# Patient Record
Sex: Female | Born: 1950 | Race: Black or African American | Hispanic: No | Marital: Married | Smoking: Never smoker
Health system: Southern US, Community
[De-identification: ages and names within clinical notes are randomized; demographics above are authoritative.]

## PROBLEM LIST (undated history)

## (undated) DIAGNOSIS — I10 Essential (primary) hypertension: Secondary | ICD-10-CM

## (undated) DIAGNOSIS — M1990 Unspecified osteoarthritis, unspecified site: Secondary | ICD-10-CM

## (undated) HISTORY — PX: JOINT REPLACEMENT: SHX530

## (undated) HISTORY — PX: ABDOMINAL HYSTERECTOMY: SHX81

---

## 2020-01-20 ENCOUNTER — Encounter (HOSPITAL_BASED_OUTPATIENT_CLINIC_OR_DEPARTMENT_OTHER): Payer: Self-pay | Admitting: Emergency Medicine

## 2020-01-20 ENCOUNTER — Emergency Department (HOSPITAL_BASED_OUTPATIENT_CLINIC_OR_DEPARTMENT_OTHER)
Admission: EM | Admit: 2020-01-20 | Discharge: 2020-01-20 | Disposition: A | Discharge status: Home / Self Care | Payer: Medicare Other | Attending: Emergency Medicine | Admitting: Emergency Medicine

## 2020-01-20 ENCOUNTER — Other Ambulatory Visit: Payer: Self-pay

## 2020-01-20 ENCOUNTER — Emergency Department (HOSPITAL_BASED_OUTPATIENT_CLINIC_OR_DEPARTMENT_OTHER): Payer: Medicare Other

## 2020-01-20 DIAGNOSIS — M25531 Pain in right wrist: Secondary | ICD-10-CM | POA: Insufficient documentation

## 2020-01-20 DIAGNOSIS — I10 Essential (primary) hypertension: Secondary | ICD-10-CM | POA: Insufficient documentation

## 2020-01-20 DIAGNOSIS — M79641 Pain in right hand: Secondary | ICD-10-CM | POA: Diagnosis present

## 2020-01-20 HISTORY — DX: Unspecified osteoarthritis, unspecified site: M19.90

## 2020-01-20 HISTORY — DX: Essential (primary) hypertension: I10

## 2020-01-20 MED ORDER — HYDROCODONE-ACETAMINOPHEN 5-325 MG PO TABS
1.0000 | ORAL_TABLET | ORAL | 0 refills | Status: AC | PRN
Start: 2020-01-20 — End: ?

## 2020-01-20 MED ORDER — MELOXICAM 15 MG PO TABS: 15.0000 mg | ORAL_TABLET | Freq: Every day | ORAL | 0 refills | Status: AC

## 2020-01-20 MED ORDER — DEXAMETHASONE SODIUM PHOSPHATE 10 MG/ML IJ SOLN
10.0000 mg | Freq: Once | INTRAMUSCULAR | Status: AC
  Administered 2020-01-20: 10 mg via INTRAMUSCULAR
  Filled 2020-01-20: qty 1

## 2020-01-20 NOTE — ED Provider Notes (Signed)
[facility] EMERGENCY DEPARTMENT Provider Note   CSN: [id number] Arrival date & time: 01/20/20  1008     History Chief Complaint  Patient presents with  . Hand Pain    Olivia Molina is a 69 y.o. female.  Pt presents to the ED today with right hand pain.  Pt said she has had hand pain for several months.  The pt said she's seen her orthopedist for her hand pain.  They recommended wearing a hand splint and f/u in 6 weeks.  That was in August.  She has not gotten the splint.  Her pcp put her on naprosyn which is not helping.         Past Medical History:  Diagnosis Date  . Arthritis   . Hypertension     There are no problems to display for this patient.      OB History   No obstetric history on file.     No family history on file.  Social History   Tobacco Use  . Smoking status: Never Smoker  . Smokeless tobacco: Never Used  Substance Use Topics  . Alcohol use: Not on file  . Drug use: Not on file    Home Medications Prior to Admission medications   Medication Sig Start Date End Date Taking? Authorizing Provider  HYDROcodone-acetaminophen (NORCO/VICODIN) 5-325 MG tablet Take 1 tablet by mouth every 4 (four) hours as needed. 01/20/20   Jacalyn Lefevre, MD  meloxicam (MOBIC) 15 MG tablet Take 1 tablet (15 mg total) by mouth daily. 01/20/20   Jacalyn Lefevre, MD    Allergies    Niacin  Review of Systems   Review of Systems  Musculoskeletal:       Right hand pain  All other systems reviewed and are negative.   Physical Exam Updated Vital Signs BP 130/88 (BP Location: Right Arm)   Pulse 60   Temp 97.9 F (36.6 C) (Oral)   Resp 18   Ht 5\' 3"  (1.6 m)   Wt 100.2 kg   SpO2 99%   BMI 39.15 kg/m   Physical Exam Vitals and nursing note reviewed.  Constitutional:      Appearance: Normal appearance.  HENT:     Head: Normocephalic and atraumatic.     Right Ear: External ear normal.     Left Ear: External ear normal.     Nose: Nose  normal.     Mouth/Throat:     Mouth: Mucous membranes are moist.     Pharynx: Oropharynx is clear.  Eyes:     Extraocular Movements: Extraocular movements intact.     Conjunctiva/sclera: Conjunctivae normal.     Pupils: Pupils are equal, round, and reactive to light.  Cardiovascular:     Rate and Rhythm: Normal rate and regular rhythm.     Pulses: Normal pulses.     Heart sounds: Normal heart sounds.  Pulmonary:     Effort: Pulmonary effort is normal.     Breath sounds: Normal breath sounds.  Abdominal:     General: Abdomen is flat. Bowel sounds are normal.     Palpations: Abdomen is soft.  Musculoskeletal:     Cervical back: Normal range of motion and neck supple.  Skin:    General: Skin is warm.     Capillary Refill: Capillary refill takes less than 2 seconds.  Neurological:     General: No focal deficit present.     Mental Status: She is alert and oriented to person, place, and time.  Psychiatric:        Mood and Affect: Mood normal.        Behavior: Behavior normal.     ED Results / Procedures / Treatments   Labs (all labs ordered are listed, but only abnormal results are displayed) Labs Reviewed - No data to display  EKG None  Radiology DG Hand Complete Right  Result Date: 01/20/2020 CLINICAL DATA:  Right hand pain with swelling in 2 fingers for 2 months. EXAM: RIGHT HAND - COMPLETE 3+ VIEW COMPARISON:  None. FINDINGS: The bones appear mildly demineralized. There is no evidence of acute fracture or dislocation. Mild degenerative changes are present at the 1st carpometacarpal joint. There are minimal degenerative changes throughout the interphalangeal and metacarpal phalangeal joints. No erosive changes or focal soft tissue abnormalities are seen. IMPRESSION: Mild degenerative changes as described. No acute findings. Electronically Signed   By: Carey Bullocks M.D.   On: 01/20/2020 11:11    Procedures Procedures (including critical care time)  Medications Ordered  in ED Medications  dexamethasone (DECADRON) injection 10 mg (10 mg Intramuscular Given 01/20/20 1212)    ED Course  I have reviewed the triage vital signs and the nursing notes.  Pertinent labs & imaging results that were available during my care of the patient were reviewed by me and considered in my medical decision making (see chart for details).    MDM Rules/Calculators/A&P                          Sx sound like carpal tunnel syndrome.  Her orthopedist recommended a resting volar splint to wear at night.  We only have cock- up splints.  She is given one of those and a dose of decadron.  She is d/c with mobic and lortab (#10) and instructed to f/u with her orthopedist.  Return if worse.  Final Clinical Impression(s) / ED Diagnoses Final diagnoses:  Right wrist pain    Rx / DC Orders ED Discharge Orders         Ordered    meloxicam (MOBIC) 15 MG tablet  Daily        01/20/20 1202    HYDROcodone-acetaminophen (NORCO/VICODIN) 5-325 MG tablet  Every 4 hours PRN        01/20/20 1202           Jacalyn Lefevre, MD 01/20/20 1412

## 2020-01-20 NOTE — ED Triage Notes (Addendum)
Pt having right hand pain with some swelling in two fingers for 2 months.  Has been seen previously for same.  Pt continues to have issues with pain worsening at night and some extreme issues with using left hand.  Pt requesting Xrays

## 2020-01-20 NOTE — Discharge Instructions (Addendum)
Volar resting splint at night.  Take Mobic instead of Naprosyn.

## 2021-05-29 ENCOUNTER — Emergency Department (HOSPITAL_BASED_OUTPATIENT_CLINIC_OR_DEPARTMENT_OTHER)
Admission: EM | Admit: 2021-05-29 | Discharge: 2021-05-29 | Disposition: A | Discharge status: Home / Self Care | Payer: Medicare (Managed Care) | Attending: Emergency Medicine | Admitting: Emergency Medicine

## 2021-05-29 ENCOUNTER — Encounter (HOSPITAL_BASED_OUTPATIENT_CLINIC_OR_DEPARTMENT_OTHER): Payer: Self-pay

## 2021-05-29 ENCOUNTER — Other Ambulatory Visit: Payer: Self-pay

## 2021-05-29 ENCOUNTER — Emergency Department (HOSPITAL_BASED_OUTPATIENT_CLINIC_OR_DEPARTMENT_OTHER): Payer: Medicare (Managed Care)

## 2021-05-29 DIAGNOSIS — M25562 Pain in left knee: Secondary | ICD-10-CM | POA: Diagnosis present

## 2021-05-29 MED ORDER — OXYCODONE-ACETAMINOPHEN 5-325 MG PO TABS: 1.0000 | ORAL_TABLET | Freq: Four times a day (QID) | ORAL | 0 refills | Status: AC | PRN

## 2021-05-29 NOTE — Discharge Instructions (Signed)
Please follow up with Dr. Jordan Likes sports medicine for further evaluation of your knee pain ? ?Use the knee immobilizer until you can be seen by the orthopedist. While at home please rest, ice, and elevate your knee to help reduce pain/inflammation.  ? ?I have prescribed a very short course of pain medication to take for severe breakthrough pain.  ? ?Return to the ED for any new/worsening symptoms ?

## 2021-05-29 NOTE — ED Provider Notes (Signed)
?[facility] EMERGENCY DEPARTMENT ?Provider Note ? ? ?CSN: [id number] ?Arrival date & time: 05/29/21  2032 ? ?  ? ?History ? ?Chief Complaint  ?Patient presents with  ? Knee Pain  ? ? ?Olivia Molina is a 71 y.o. female who presents to the ED today with complaint of gradual onset, constant, achiness to left knee that began 2 to 3 days ago.  Patient reports she had been working out in the yard and applying mulch which she attributed to her knee pain.  She states that she was able to go to the gym today and do her regular exercises however when she went home she felt something pop in her knee and she has been having severe pain since that time.  She states that her pain is worse with ambulation and bearing weight on the knee.  She has no other complaints at this time.  ? ?The history is provided by the patient and medical records.  ? ?  ? ?Home Medications ?Prior to Admission medications   ?Medication Sig Start Date End Date Taking? Authorizing Provider  ?oxyCODONE-acetaminophen (PERCOCET/ROXICET) 5-325 MG tablet Take 1 tablet by mouth every 6 (six) hours as needed for severe pain. 05/29/21  Yes Quanta Robertshaw, PA-C  ?HYDROcodone-acetaminophen (NORCO/VICODIN) 5-325 MG tablet Take 1 tablet by mouth every 4 (four) hours as needed. 01/20/20   Jacalyn Lefevre, MD  ?meloxicam (MOBIC) 15 MG tablet Take 1 tablet (15 mg total) by mouth daily. 01/20/20   Jacalyn Lefevre, MD  ?   ? ?Allergies    ?Niacin   ? ?Review of Systems   ?Review of Systems  ?Constitutional:  Negative for chills and fever.  ?Musculoskeletal:  Positive for arthralgias and joint swelling.  ?All other systems reviewed and are negative. ? ?Physical Exam ?Updated Vital Signs ?BP 140/70 (BP Location: Left Arm)   Pulse (!) 51   Temp 99.1 ?F (37.3 ?C) (Oral)   Resp 18   Ht 5\' 3"  (1.6 m)   Wt 99.8 kg   SpO2 98%   BMI 38.97 kg/m?  ?Physical Exam ?Vitals and nursing note reviewed.  ?Constitutional:   ?   Appearance: She is not ill-appearing.  ?HENT:  ?    Head: Normocephalic and atraumatic.  ?Eyes:  ?   Conjunctiva/sclera: Conjunctivae normal.  ?Cardiovascular:  ?   Rate and Rhythm: Normal rate and regular rhythm.  ?Pulmonary:  ?   Effort: Pulmonary effort is normal.  ?   Breath sounds: Normal breath sounds.  ?Musculoskeletal:  ?   Comments: Mild swelling noted to the left knee compared to the right.  No obvious tenderness palpation to the knee.  Range of motion limited secondary to pain.  Mild crepitus with ranging.  Negative anterior and posterior drawer test.  No varus or valgus laxity.  2+ DP pulse.  ?Skin: ?   General: Skin is warm and dry.  ?   Coloration: Skin is not jaundiced.  ?Neurological:  ?   Mental Status: She is alert.  ? ? ?ED Results / Procedures / Treatments   ?Labs ?(all labs ordered are listed, but only abnormal results are displayed) ?Labs Reviewed - No data to display ? ?EKG ?None ? ?Radiology ?DG Knee Complete 4 Views Left ? ?Result Date: 05/29/2021 ?CLINICAL DATA:  Knee pain EXAM: LEFT KNEE - COMPLETE 4+ VIEW COMPARISON:  None. FINDINGS: No evidence of fracture, dislocation, or joint effusion. No evidence of arthropathy or other focal bone abnormality. Soft tissues are unremarkable. IMPRESSION: Negative. Electronically Signed  By: Deatra Robinson M.D.   On: 05/29/2021 21:10   ? ?Procedures ?Procedures  ? ? ?Medications Ordered in ED ?Medications - No data to display ? ?ED Course/ Medical Decision Making/ A&P ?  ?                        ?Medical Decision Making ?71 year old female who presents to the ED today with complaint of left knee pain x 2-3 days worsening pain today.  On arrival vitals are stable.  X-ray has been obtained, currently pending.  On exam she is no obvious tenderness palpation however with ranging she elicits pain.  She has positive crepitus.  No varus or valgus laxity and no sign of ACL/PCL tear.  Will await x-ray at this time.  ? ?Extreme negative.  Patient provided with knee immobilizer.  I had lengthy discussion with her  regarding crutches however she is adamant she does not want them.  She does have a cane that she is currently using.  She was evaluated with knee immobilizer on and using cane, stable.  She is recommended on RICE therapy and Ortho follow-up.  She is in agreement with plan at this time and stable for discharge. ? ?Problems Addressed: ?Acute pain of left knee: acute illness or injury ? ?Amount and/or Complexity of Data Reviewed ?Radiology: ordered. ?   Details: Xray viewed by myself - no acute findings. Confirmed by radiologist. ? ? ? ? ? ? ? ? ? ?Final Clinical Impression(s) / ED Diagnoses ?Final diagnoses:  ?Acute pain of left knee  ? ? ?Rx / DC Orders ?ED Discharge Orders   ? ?      Ordered  ?  oxyCODONE-acetaminophen (PERCOCET/ROXICET) 5-325 MG tablet  Every 6 hours PRN       ? 05/29/21 2217  ? ?  ?  ? ?  ? ? ? ?Discharge Instructions   ? ?  ?Please follow up with Dr. Jordan Likes sports medicine for further evaluation of your knee pain ? ?Use the knee immobilizer until you can be seen by the orthopedist. While at home please rest, ice, and elevate your knee to help reduce pain/inflammation.  ? ?I have prescribed a very short course of pain medication to take for severe breakthrough pain.  ? ?Return to the ED for any new/worsening symptoms ? ? ? ? ?  ?Tanda Rockers, PA-C ?05/29/21 2219 ? ?  ?Terrilee Files, MD ?05/30/21 [phone] ? ?

## 2021-05-29 NOTE — ED Triage Notes (Signed)
Pt arrives with c/o left knee that has gotten worse today after she was at the gym. Per pt, she felt like something popped in her knee. Pt ambulatory to triage room.   ?

## 2022-11-27 IMAGING — DX DG KNEE COMPLETE 4+V*L*
4 series · 4 of 4 positions shown · non-contrast
Comparison: None.

CLINICAL DATA: Knee pain

EXAM:
LEFT KNEE - COMPLETE 4+ VIEW

[knee ap]
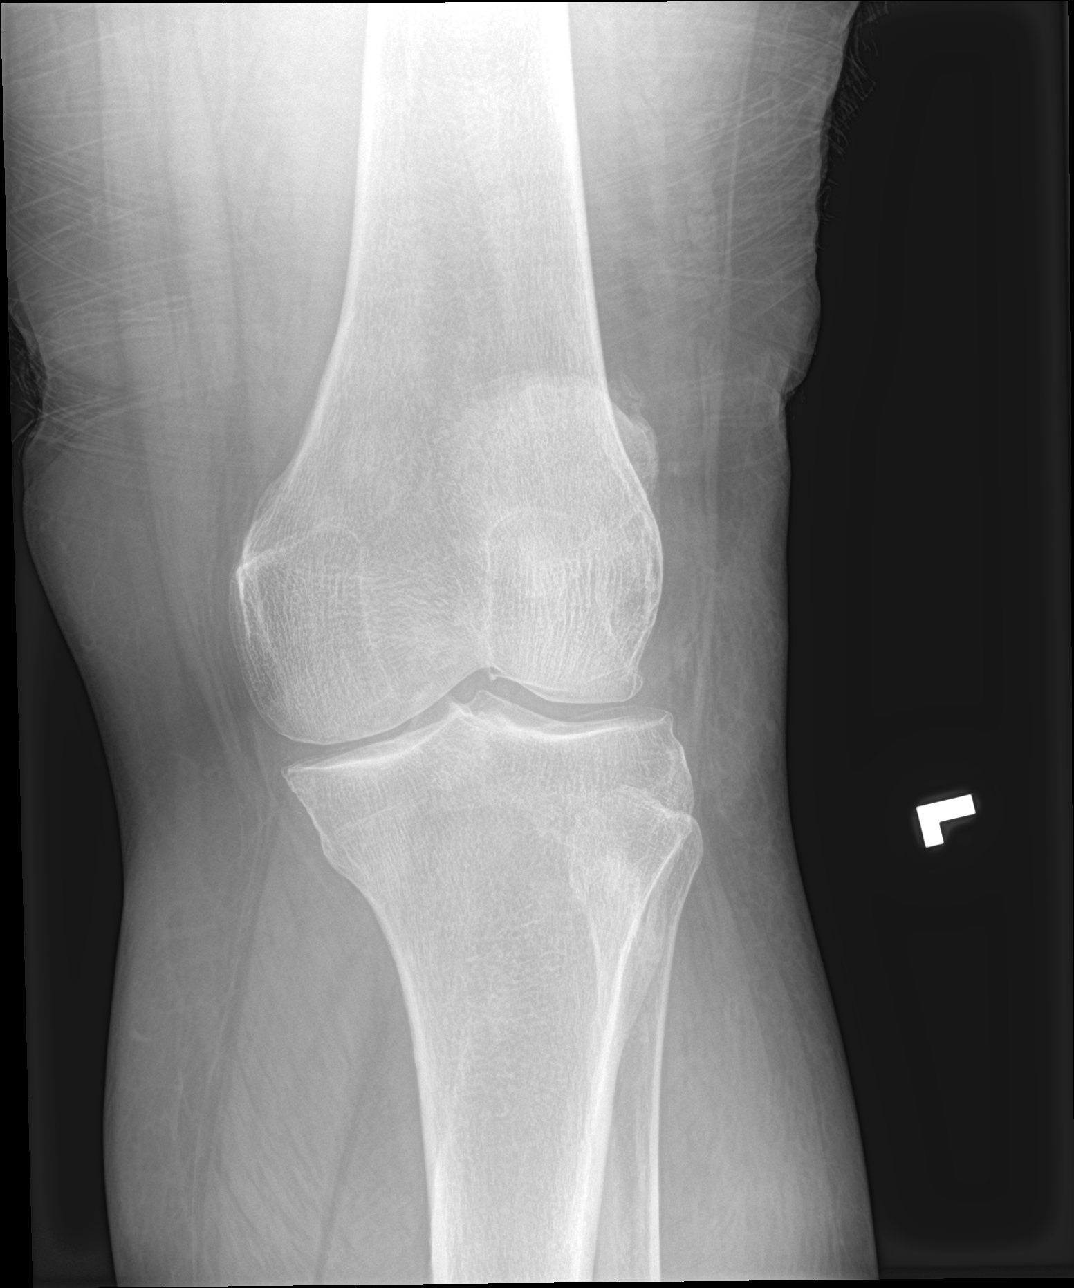

[knee lat]
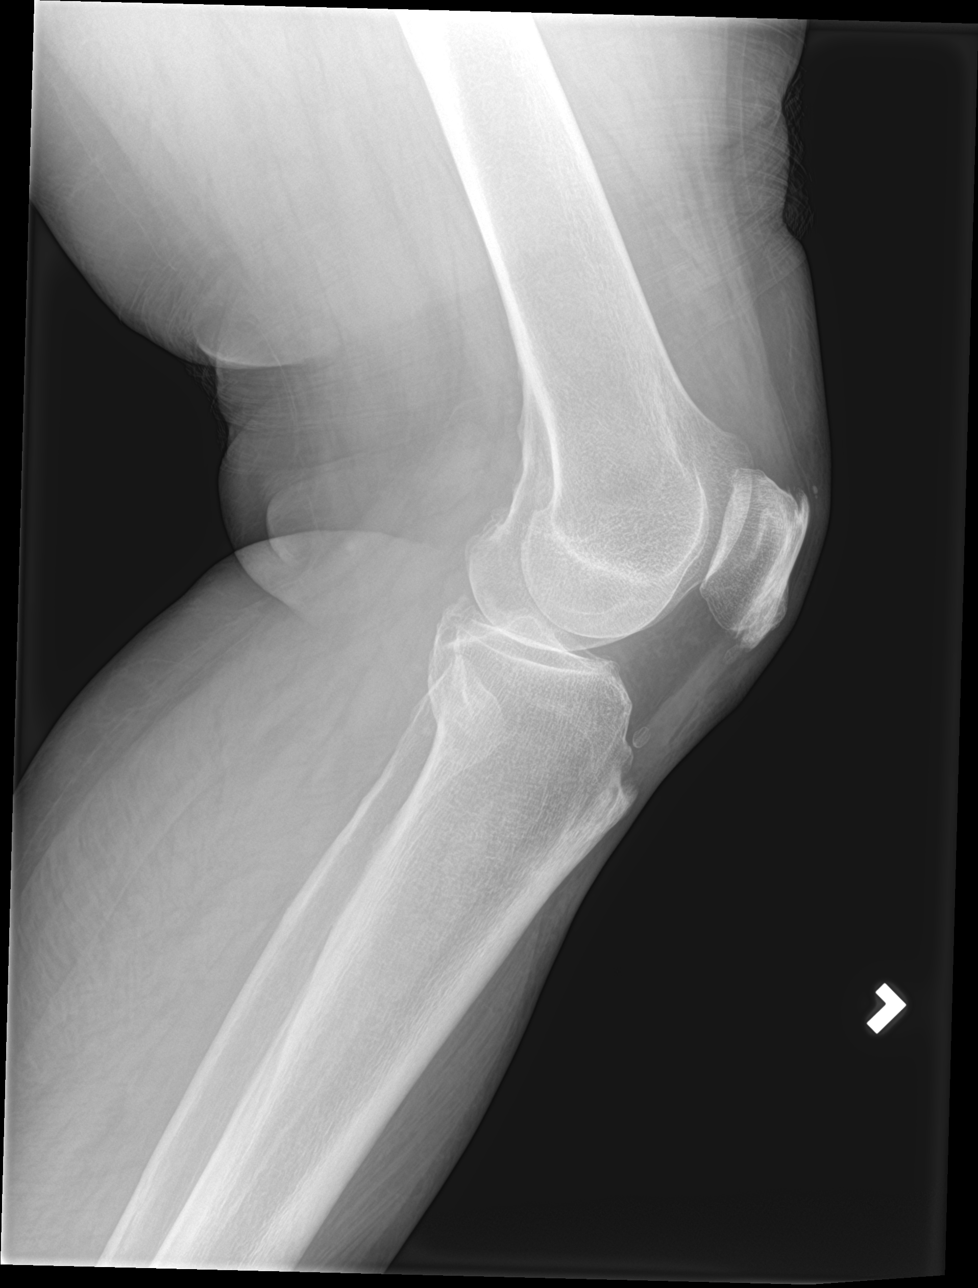

[knee obl (1 of 2)]
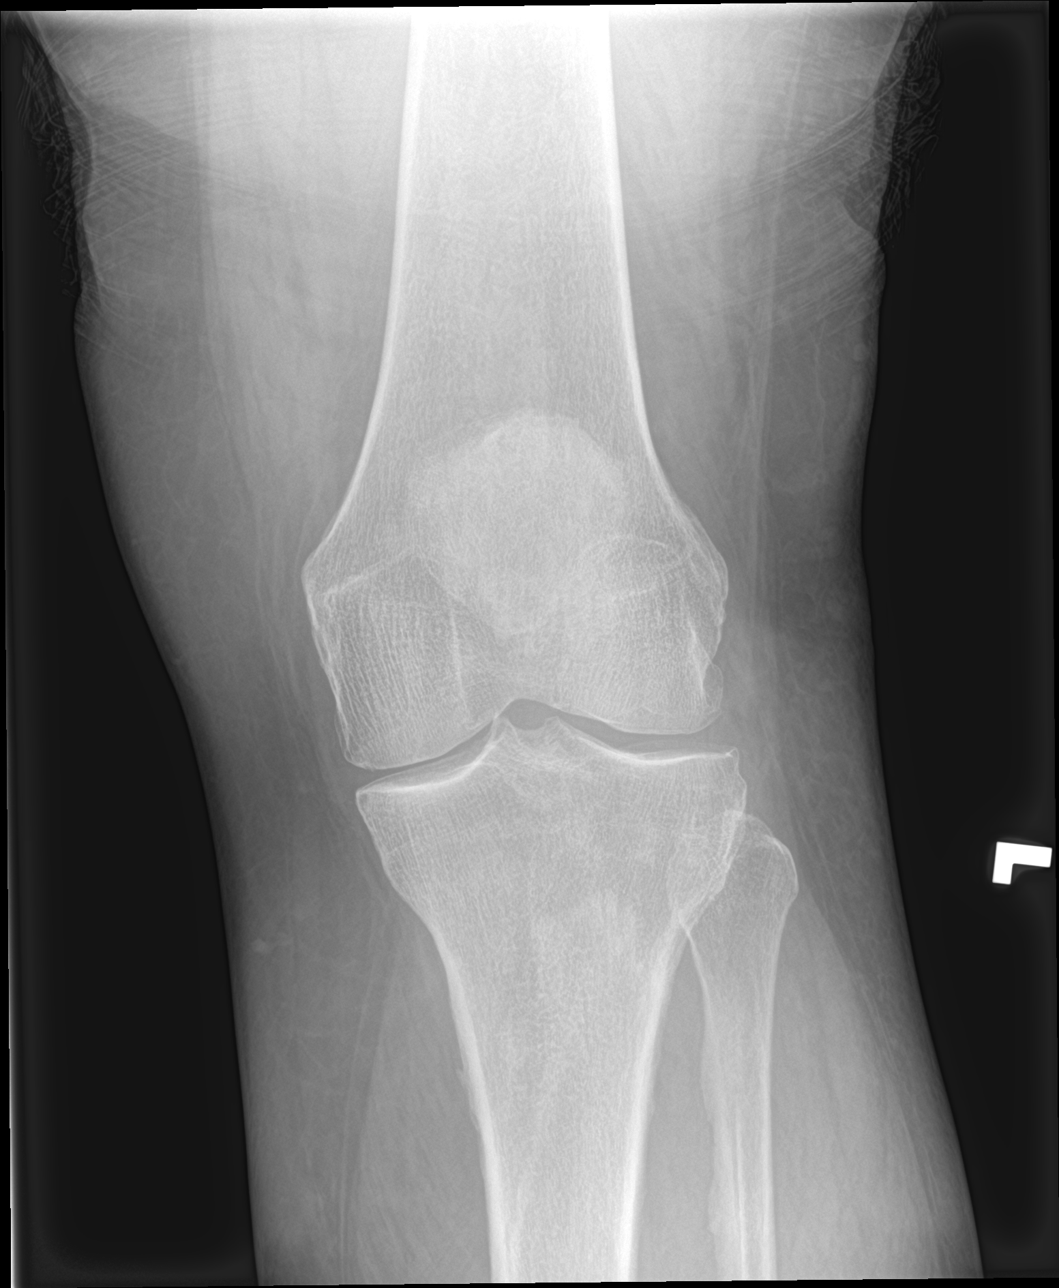

[knee obl (2 of 2)]
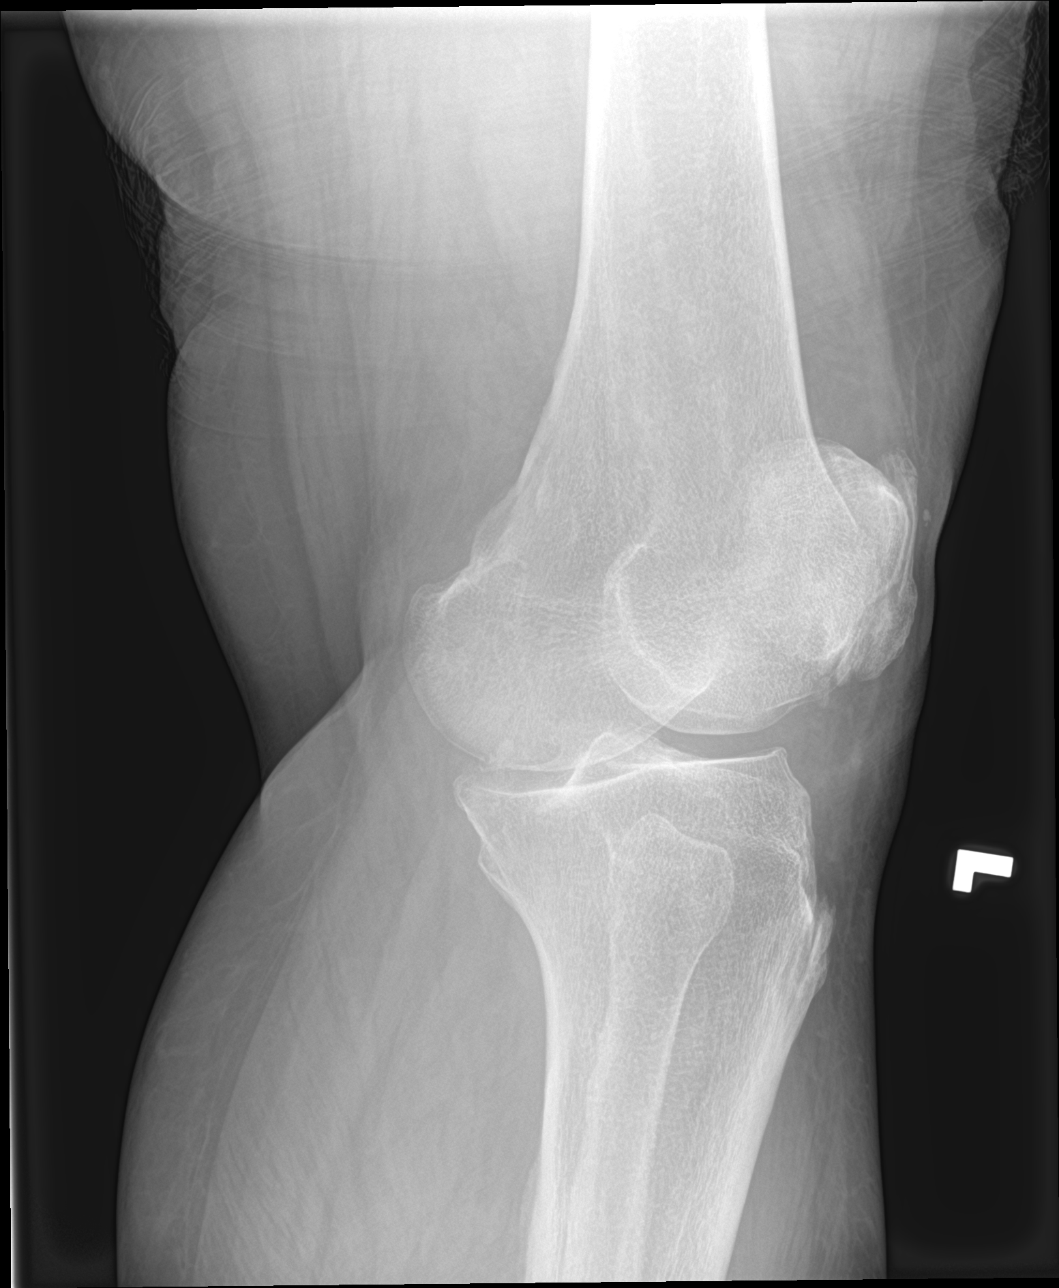

[4 of 4 positions shown; findings below may reference images not displayed]

FINDINGS: No evidence of fracture, dislocation, or joint effusion. No evidence
of arthropathy or other focal bone abnormality. Soft tissues are
unremarkable.
IMPRESSION: Negative.

## 2024-02-08 ENCOUNTER — Emergency Department (HOSPITAL_BASED_OUTPATIENT_CLINIC_OR_DEPARTMENT_OTHER)
Admission: EM | Admit: 2024-02-08 | Discharge: 2024-02-08 | Disposition: A | Discharge status: Home / Self Care | Attending: Emergency Medicine | Admitting: Emergency Medicine

## 2024-02-08 ENCOUNTER — Emergency Department (HOSPITAL_BASED_OUTPATIENT_CLINIC_OR_DEPARTMENT_OTHER)

## 2024-02-08 ENCOUNTER — Other Ambulatory Visit: Payer: Self-pay

## 2024-02-08 ENCOUNTER — Encounter (HOSPITAL_BASED_OUTPATIENT_CLINIC_OR_DEPARTMENT_OTHER): Payer: Self-pay | Admitting: Emergency Medicine

## 2024-02-08 DIAGNOSIS — J069 Acute upper respiratory infection, unspecified: Secondary | ICD-10-CM

## 2024-02-08 DIAGNOSIS — R059 Cough, unspecified: Secondary | ICD-10-CM | POA: Diagnosis present

## 2024-02-08 LAB — RESP PANEL BY RT-PCR (RSV, FLU A&B, COVID)  RVPGX2
Influenza A by PCR: NEGATIVE
Influenza B by PCR: NEGATIVE
Resp Syncytial Virus by PCR: NEGATIVE
SARS Coronavirus 2 by RT PCR: NEGATIVE

## 2024-02-08 MED ORDER — AZITHROMYCIN 250 MG PO TABS: 250.0000 mg | ORAL_TABLET | Freq: Every day | ORAL | 0 refills | Status: AC

## 2024-02-08 MED ORDER — BENZONATATE 100 MG PO CAPS: 100.0000 mg | ORAL_CAPSULE | Freq: Three times a day (TID) | ORAL | 0 refills | Status: AC

## 2024-02-08 NOTE — Discharge Instructions (Signed)
 This could be normal that you are having ongoing cough.  I did start you on an antibiotic to cover you for pertussis.  If your symptoms persist your doctor may want to work this up further.

## 2024-02-08 NOTE — ED Notes (Signed)
 Discharge instructions reviewed with patient. Patient verbalizes understanding, no further questions at this time. Medications/prescriptions and follow up information provided. No acute distress noted at time of departure.

## 2024-02-08 NOTE — ED Provider Notes (Signed)
 [place] EMERGENCY DEPARTMENT AT [facility] Provider Note   CSN: [id number] Arrival date & time: 02/08/24  1043     Patient presents with: Cough   Olivia Molina is a 73 y.o. female.   73 yo F with a chief complaints of a cough.  This been going on for a couple weeks.  Her husband was sick with a similar illness but he is doing much better now.  She describes it coming suddenly and severely.  Seems to occur off and on.  Notices it maybe a little bit more when she eats.  Sometimes feels like maybe she chokes on her food or like she had some trouble swallowing it.  She denies one-sided numbness or weakness or difficulty with speech.     Cough      Prior to Admission medications  Medication Sig Start Date End Date Taking? Authorizing Provider  azithromycin  (ZITHROMAX ) 250 MG tablet Take 1 tablet (250 mg total) by mouth daily. Take first 2 tablets together, then 1 every day until finished. 02/08/24  Yes Emil Share, DO  benzonatate  (TESSALON ) 100 MG capsule Take 1 capsule (100 mg total) by mouth every 8 (eight) hours. 02/08/24  Yes Emil Share, DO  brimonidine (ALPHAGAN) 0.2 % ophthalmic solution SMARTSIG:In Eye(s)    [provider]  ezetimibe (ZETIA) 10 MG tablet Take 10 mg by mouth daily.    [provider]  hydrochlorothiazide (HYDRODIURIL) 12.5 MG tablet Take by mouth.    [provider]  HYDROcodone -acetaminophen  (NORCO/VICODIN) 5-325 MG tablet Take 1 tablet by mouth every 4 (four) hours as needed. 01/20/20   Haviland, Julie, MD  latanoprost (XALATAN) 0.005 % ophthalmic solution SMARTSIG:In Eye(s)    [provider]  meloxicam  (MOBIC ) 15 MG tablet Take 1 tablet (15 mg total) by mouth daily. 01/20/20   Haviland, Julie, MD  metoprolol succinate (TOPROL-XL) 100 MG 24 hr tablet Take 100 mg by mouth daily.    [provider]  oxyCODONE -acetaminophen  (PERCOCET/ROXICET) 5-325 MG tablet Take 1 tablet by mouth every 6 (six) hours as  needed for severe pain. 05/29/21   Shepard Clinch, PA-C  spironolactone (ALDACTONE) 25 MG tablet Take 25 mg by mouth daily.    [provider]    Allergies: Niacin    Review of Systems  Respiratory:  Positive for cough.     Updated Vital Signs BP (!) 145/78 (BP Location: Right Arm)   Pulse (!) 49   Temp 98.2 F (36.8 C) (Oral)   Resp 18   Ht 5' 3 (1.6 m)   SpO2 98%   BMI 38.97 kg/m   Physical Exam Vitals and nursing note reviewed.  Constitutional:      General: She is not in acute distress.    Appearance: She is well-developed. She is not diaphoretic.  HENT:     Head: Normocephalic and atraumatic.     Nose:     Comments: Swollen turbinates posterior nasal drip.  Palate elevates symmetrically.  Tolerating secretions without difficulty. Eyes:     Pupils: Pupils are equal, round, and reactive to light.  Cardiovascular:     Rate and Rhythm: Normal rate and regular rhythm.     Heart sounds: No murmur heard.    No friction rub. No gallop.  Pulmonary:     Effort: Pulmonary effort is normal.     Breath sounds: No wheezing or rales.  Abdominal:     General: There is no distension.     Palpations: Abdomen is soft.  Tenderness: There is no abdominal tenderness.  Musculoskeletal:        General: No tenderness.     Cervical back: Normal range of motion and neck supple.  Skin:    General: Skin is warm and dry.  Neurological:     Mental Status: She is alert and oriented to person, place, and time.  Psychiatric:        Behavior: Behavior normal.     (all labs ordered are listed, but only abnormal results are displayed) Labs Reviewed  RESP PANEL BY RT-PCR (RSV, FLU A&B, COVID)  [id number]    EKG: None  Radiology: No results found.   Procedures   Medications Ordered in the ED - No data to display                                  Medical Decision Making Amount and/or Complexity of Data Reviewed Radiology: ordered.  Risk Prescription drug  management.   73 yo F with a chief complaints of a cough.  This been going on for a couple weeks now.  The way she describes it makes me concerned for possible pertussis.  Will start on oral antibiotics.  She does describe some difficulty with swallowing.  I think this is less likely to be neurologic dysfunction.  She clinically also has signs and symptoms consistent with URI, nasal congestion posterior nasal drip.  No obvious trouble with phonation or obvious cranial nerve deficit.  Will trial antibiotics.  PCP follow-up.  12:35 PM:  I have discussed the diagnosis/risks/treatment options with the patient.  Evaluation and diagnostic testing in the emergency department does not suggest an emergent condition requiring admission or immediate intervention beyond what has been performed at this time.  They will follow up with PCP. We also discussed returning to the ED immediately if new or worsening sx occur. We discussed the sx which are most concerning (e.g., sudden worsening pain, fever, inability to tolerate by mouth) that necessitate immediate return. Medications administered to the patient during their visit and any new prescriptions provided to the patient are listed below.  Medications given during this visit Medications - No data to display   The patient appears reasonably screen and/or stabilized for discharge and I doubt any other medical condition or other [facility] requiring further screening, evaluation, or treatment in the ED at this time prior to discharge.       Final diagnoses:  Viral URI with cough    ED Discharge Orders          Ordered    azithromycin  (ZITHROMAX ) 250 MG tablet  Daily        02/08/24 1229    benzonatate  (TESSALON ) 100 MG capsule  Every 8 hours        02/08/24 1229               Emil Share, DO 02/08/24 1235

## 2024-02-08 NOTE — ED Triage Notes (Addendum)
 Pt c/o productive cough x 2 weeks, denies fever. Reports cough is worse after eating.
